# Patient Record
Sex: Female | Born: 1935 | Race: Black or African American | Hispanic: No | Marital: Single | Smoking: Never smoker
Health system: Southern US, Community
[De-identification: ages and names within clinical notes are randomized; demographics above are authoritative.]

## PROBLEM LIST (undated history)

## (undated) DIAGNOSIS — I10 Essential (primary) hypertension: Secondary | ICD-10-CM

## (undated) DIAGNOSIS — H409 Unspecified glaucoma: Secondary | ICD-10-CM

## (undated) HISTORY — DX: Essential (primary) hypertension: I10

## (undated) HISTORY — DX: Unspecified glaucoma: H40.9

---

## 1997-10-30 ENCOUNTER — Other Ambulatory Visit: Admission: RE | Admit: 1997-10-30 | Discharge: 1997-10-30 | Payer: Self-pay | Admitting: Family Medicine

## 2005-03-22 ENCOUNTER — Emergency Department (HOSPITAL_COMMUNITY): Admission: EM | Admit: 2005-03-22 | Discharge: 2005-03-23 | Payer: Self-pay | Admitting: Emergency Medicine

## 2006-07-30 ENCOUNTER — Emergency Department (HOSPITAL_COMMUNITY): Admission: EM | Admit: 2006-07-30 | Discharge: 2006-07-30 | Payer: Self-pay | Admitting: Emergency Medicine

## 2010-05-08 ENCOUNTER — Encounter: Admission: RE | Admit: 2010-05-08 | Discharge: 2010-05-08 | Payer: Self-pay | Admitting: Family Medicine

## 2017-05-11 ENCOUNTER — Encounter: Payer: Self-pay | Admitting: Nurse Practitioner

## 2017-05-23 ENCOUNTER — Ambulatory Visit: Payer: Self-pay | Admitting: Nurse Practitioner

## 2017-06-03 ENCOUNTER — Ambulatory Visit: Payer: Medicaid Other | Admitting: Physician Assistant

## 2017-06-16 ENCOUNTER — Ambulatory Visit: Payer: Medicaid Other | Admitting: Physician Assistant

## 2017-06-22 ENCOUNTER — Other Ambulatory Visit: Payer: Self-pay | Admitting: *Deleted

## 2017-06-22 ENCOUNTER — Encounter: Payer: Self-pay | Admitting: *Deleted

## 2017-06-29 ENCOUNTER — Other Ambulatory Visit (INDEPENDENT_AMBULATORY_CARE_PROVIDER_SITE_OTHER): Payer: Medicare Other

## 2017-06-29 ENCOUNTER — Encounter: Payer: Self-pay | Admitting: Physician Assistant

## 2017-06-29 ENCOUNTER — Ambulatory Visit (INDEPENDENT_AMBULATORY_CARE_PROVIDER_SITE_OTHER): Payer: Medicare Other | Admitting: Physician Assistant

## 2017-06-29 VITALS — BP 126/62 | HR 74 | Ht 60.0 in | Wt 178.1 lb

## 2017-06-29 DIAGNOSIS — Z90710 Acquired absence of both cervix and uterus: Secondary | ICD-10-CM | POA: Insufficient documentation

## 2017-06-29 DIAGNOSIS — R945 Abnormal results of liver function studies: Secondary | ICD-10-CM

## 2017-06-29 DIAGNOSIS — I10 Essential (primary) hypertension: Secondary | ICD-10-CM | POA: Diagnosis not present

## 2017-06-29 DIAGNOSIS — H905 Unspecified sensorineural hearing loss: Secondary | ICD-10-CM | POA: Diagnosis not present

## 2017-06-29 DIAGNOSIS — R7989 Other specified abnormal findings of blood chemistry: Secondary | ICD-10-CM

## 2017-06-29 DIAGNOSIS — K219 Gastro-esophageal reflux disease without esophagitis: Secondary | ICD-10-CM

## 2017-06-29 LAB — CBC WITH DIFFERENTIAL/PLATELET
Basophils Absolute: 0.1 10*3/uL (ref 0.0–0.1)
Basophils Relative: 1 % (ref 0.0–3.0)
Eosinophils Absolute: 0.2 10*3/uL (ref 0.0–0.7)
Eosinophils Relative: 3 % (ref 0.0–5.0)
HCT: 36 % (ref 36.0–46.0)
Hemoglobin: 11.4 g/dL — ABNORMAL LOW (ref 12.0–15.0)
Lymphocytes Relative: 34.1 % (ref 12.0–46.0)
Lymphs Abs: 2.1 10*3/uL (ref 0.7–4.0)
MCHC: 31.7 g/dL (ref 30.0–36.0)
MCV: 89 fl (ref 78.0–100.0)
Monocytes Absolute: 0.6 10*3/uL (ref 0.1–1.0)
Monocytes Relative: 9.9 % (ref 3.0–12.0)
Neutro Abs: 3.2 10*3/uL (ref 1.4–7.7)
Neutrophils Relative %: 52 % (ref 43.0–77.0)
Platelets: 245 10*3/uL (ref 150.0–400.0)
RBC: 4.05 Mil/uL (ref 3.87–5.11)
RDW: 14.3 % (ref 11.5–15.5)
WBC: 6.2 10*3/uL (ref 4.0–10.5)

## 2017-06-29 LAB — COMPREHENSIVE METABOLIC PANEL
ALBUMIN: 3.9 g/dL (ref 3.5–5.2)
ALT: 37 U/L — AB (ref 0–35)
AST: 39 U/L — AB (ref 0–37)
Alkaline Phosphatase: 388 U/L — ABNORMAL HIGH (ref 39–117)
BILIRUBIN TOTAL: 0.4 mg/dL (ref 0.2–1.2)
BUN: 29 mg/dL — AB (ref 6–23)
CALCIUM: 9.8 mg/dL (ref 8.4–10.5)
CO2: 27 mEq/L (ref 19–32)
CREATININE: 0.91 mg/dL (ref 0.40–1.20)
Chloride: 105 mEq/L (ref 96–112)
GFR: 74.93 mL/min (ref >60.00)
Glucose, Bld: 115 mg/dL — ABNORMAL HIGH (ref 70–99)
Potassium: 4.1 mEq/L (ref 3.5–5.1)
SODIUM: 139 meq/L (ref 135–145)
Total Protein: 7.9 g/dL (ref 6.0–8.3)

## 2017-06-29 LAB — LIPASE: Lipase: 26 U/L (ref 11.0–59.0)

## 2017-06-29 NOTE — Progress Notes (Signed)
Agree with assessment and plan as outlined. Repeat LFTs and await imaging first. If imaging normal and LFT elevation persists will need further serologic workup. Drug induced liver injury also possible, not sure if any medications or supplements are new which could correlate with time course.

## 2017-06-29 NOTE — Patient Instructions (Signed)
Please go to the basement level to have your labs drawn.  You have been scheduled for a CT scan of the abdomen and pelvis at [facility] ([address]---this is in the same building as Press [facility]).   You are scheduled on Thursday 07-07-2017 at 9:30 am You should arrive at 9:15 minutes prior to your appointment time for registration. Please follow the written instructions below on the day of your exam:  WARNING: IF YOU ARE ALLERGIC TO IODINE/X-RAY DYE, PLEASE NOTIFY RADIOLOGY IMMEDIATELY AT [phone]! YOU WILL BE GIVEN A 13 HOUR PREMEDICATION PREP.  1) Do not eat  anything after 5:30 am (4 hours prior to your test) 2) You have been given 2 bottles of oral contrast to drink. The solution may taste               better if refrigerated, but do NOT add ice or any other liquid to this solution. Shake             well before drinking.    Drink 1 bottle of contrast @ 7:30 am (2 hours prior to your exam)  Drink 1 bottle of contrast @ 8:30 am (1 hour prior to your exam)  You may take any medications as prescribed with a small amount of water except for the following: Metformin, Glucophage, Glucovance, Avandamet, Riomet, Fortamet, Actoplus Met, Janumet, Glumetza or Metaglip. The above medications must be held the day of the exam AND 48 hours after the exam.  The purpose of you drinking the oral contrast is to aid in the visualization of your intestinal tract. The contrast solution may cause some diarrhea. Before your exam is started, you will be given a small amount of fluid to drink. Depending on your individual set of symptoms, you may also receive an intravenous injection of x-ray contrast/dye. Plan on being at [facility] for 30 minutes or long, depending on the type of exam you are having performed.  If you have any questions regarding your exam or if you need to reschedule, you may call the CT department at [phone] between the hours of 8:00 am and 5:00 pm,  Monday-Friday.  ________________________________________________________________________

## 2017-06-29 NOTE — Progress Notes (Signed)
Subjective:    Patient ID: Denise Wong, female    DOB: 12/19/1926, 83 y.o.   MRN: [id number]  HPI Denise Wong is a Denise 83 year old African-American female, referred today by [facility]/Dr Cyndie ChimeNguyen for evaluation of elevation of LFTs. Patient has history of congenital deafness, hypertension, hyperlipidemia, GERD and osteoarthritis. She is status post hysterectomy. Communication is difficult with this patient but she is able to read and write simple answers. She denies any abdominal pain, nausea or vomiting. The staff member who accompanied her today says she eats very well. Patient is not aware of any history of liver disease. She had labs*on 04/26/2017 showing a T bili 0.3, ALT of 99, AST of 108 and alkaline phosphatase of 782. I do not have any other labs or imaging available today. She does not have any records available in Epic.  Review of Systems Pertinent positive and negative review of systems were noted in the above HPI section.  All other review of systems was otherwise negative.  Outpatient Encounter Medications as of 06/29/2017  Medication Sig  . Acetaminophen 500 MG coapsule Take by mouth as needed.  Marland Kitchen. amLODipine (NORVASC) 5 MG tablet Take 5 mg by mouth daily.  Marland Kitchen. atorvastatin (LIPITOR) 20 MG tablet Take 20 mg by mouth daily.  . carvedilol (COREG) 12.5 MG tablet Take 12.5 mg by mouth 2 (two) times daily with a meal.  . Cholecalciferol (VITAMIN D3) 1000 units CAPS Take by mouth.  . fexofenadine (ALLEGRA) 180 MG tablet Take 180 mg by mouth daily.  . fluticasone (FLOVENT DISKUS) 50 MCG/BLIST diskus inhaler Inhale 1 puff into the lungs 2 (two) times daily.  Marland Kitchen. gabapentin (NEURONTIN) 300 MG capsule Take 300 mg by mouth 3 (three) times daily.  . Melatonin 3 MG CAPS Take by mouth 1 day or 1 dose.  . meloxicam (MOBIC) 7.5 MG tablet Take 7.5 mg by mouth daily.  Marland Kitchen. Phenylephrine-DM-GG (MUCINEX COLD CHILDRENS PO) Take 15 mLs by mouth 2 (two) times daily.  Bertram Gala.  Polyethyl Glycol-Propyl Glycol (SYSTANE) 0.4-0.3 % SOLN Apply to eye.  . polyvinyl alcohol (LIQUIFILM TEARS) 1.4 % ophthalmic solution Place 1 drop into both eyes as needed.  Marland Kitchen. QUEtiapine (SEROQUEL) 50 MG tablet Take 50 mg by mouth at bedtime.  . ranitidine (ZANTAC) 150 MG tablet Take 150 mg by mouth 2 (two) times daily.  . sertraline (ZOLOFT) 25 MG tablet Take 25 mg by mouth daily.  . timolol (BETIMOL) 0.5 % ophthalmic solution Place 1 drop into both eyes 2 (two) times daily.  . timolol (TIMOPTIC) 0.5 % ophthalmic solution Place 1 drop into both eyes 2 (two) times daily.  . Travoprost, BAK Free, (TRAVATAN Z) 0.004 % SOLN ophthalmic solution Place 1 drop into both eyes at bedtime.  . trolamine salicylate (ASPERCREME) 10 % cream Apply 1 application topically as needed for muscle pain.  Marland Kitchen. venlafaxine (EFFEXOR) 75 MG tablet Take 75 mg by mouth 2 (two) times daily.   No facility-administered encounter medications on file as of 06/29/2017.    Not on File There are no active problems to display for this patient.  Social History   Socioeconomic History  . Marital status: Single    Spouse name: Not on file  . Number of children: Not on file  . Years of education: Not on file  . Highest education level: Not on file  Social Needs  . Financial resource strain: Not on file  . Food insecurity - worry: Not on file  .  Food insecurity - inability: Not on file  . Transportation needs - medical: Not on file  . Transportation needs - non-medical: Not on file  Occupational History  . Not on file  Tobacco Use  . Smoking status: Never Smoker  . Smokeless tobacco: Never Used  Substance and Sexual Activity  . Alcohol use: No    Frequency: Never  . Drug use: No  . Sexual activity: No  Other Topics Concern  . Not on file  Social History Narrative  . Not on file    Denise Wong'Denise family history is not on file.      Objective:    Vitals:   06/29/17 1407  BP: 126/62  Pulse: 74    Physical  Exam  well-developed elderly African-American female in no acute distress, patient is deaf, she communicates by writing, she does not saw 9 and does not read lips. She is accompanied by a staff member from her assisted living. Blood pressure 126/62 pulse 74, BMI 34.7. HEENT; nontraumatic normocephalic EOMI PERRLA sclera anicteric, Cardiovascular; regular rate and rhythm with S1-S2, Pulmonary; clear bilaterally, Abdomen ;soft, nontender nondistended no palpable mass or hepatosplenomegaly, no fluid wave, bowel sounds are present,, she has a low midline incisional scar Rectal; exam not done, Ext no clubbing cyanosis or edema skin warm and dry, Neuropsych; mood and affect appropriate       Assessment & Plan:   #431 83 year old African-American female with congenital deafness, referred for elevated LFTs. Patient is asymptomatic. Etiology is not clear, rule out chronic hepatitis, underlying chronic liver disease, malignancy or drug-induced. #2 hypertension #3 GERD #4 hyperlipidemia #5 osteoarthritis  Plan; CBC with differential, CMET, lipase, Patient will be scheduled for CT scan of the abdomen and pelvis with contrast Further plans pending results of above. Patient will be established with Dr. Adela LankArmbruster.  Denise Wong Denise Shmiel Morton PA-C 06/29/2017   Cc: No ref. provider found

## 2017-07-07 ENCOUNTER — Ambulatory Visit (INDEPENDENT_AMBULATORY_CARE_PROVIDER_SITE_OTHER)
Admission: RE | Admit: 2017-07-07 | Discharge: 2017-07-07 | Disposition: A | Discharge status: Home / Self Care | Payer: Medicare Other | Source: Ambulatory Visit | Attending: Physician Assistant | Admitting: Physician Assistant

## 2017-07-07 DIAGNOSIS — R945 Abnormal results of liver function studies: Secondary | ICD-10-CM | POA: Diagnosis not present

## 2017-07-07 DIAGNOSIS — R7989 Other specified abnormal findings of blood chemistry: Secondary | ICD-10-CM

## 2017-07-07 MED ORDER — IOPAMIDOL (ISOVUE-300) INJECTION 61%
100.0000 mL | Freq: Once | INTRAVENOUS | Status: AC | PRN
  Administered 2017-07-07: 100 mL via INTRAVENOUS

## 2017-08-25 ENCOUNTER — Ambulatory Visit: Payer: Medicaid Other | Admitting: Gastroenterology

## 2018-02-19 IMAGING — CT CT ABD-PELV W/ CM
2 of 5 series · 15 of 46 positions shown, 17 images · IV contrast (iopamidol)
Comparison: None.

CLINICAL DATA: Elevated liver enzymes

EXAM:
CT ABDOMEN AND PELVIS WITH CONTRAST
TECHNIQUE: Multidetector CT imaging of the abdomen and pelvis was performed
using the standard protocol following bolus administration of
intravenous contrast. Oral contrast was also administered.
CONTRAST:  100mL [id number] IOPAMIDOL ([id number]) INJECTION 61%

[Series 2: abd/pel w · axial · 0.76mm/px · z∈[-390,-10]mm · 12 of 86 slices shown, 14 images]
[im 5/86  soft-tissue]
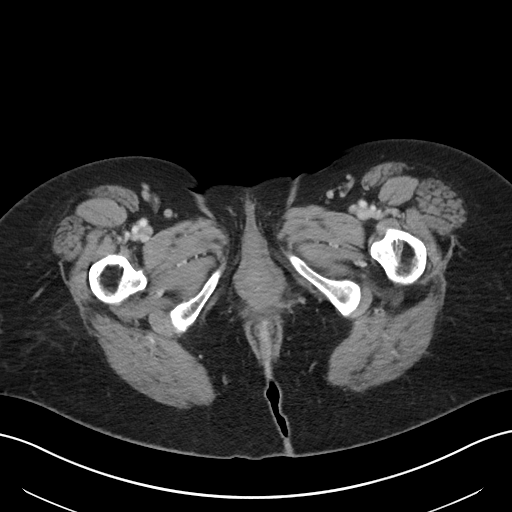
[im 5/86  bone]
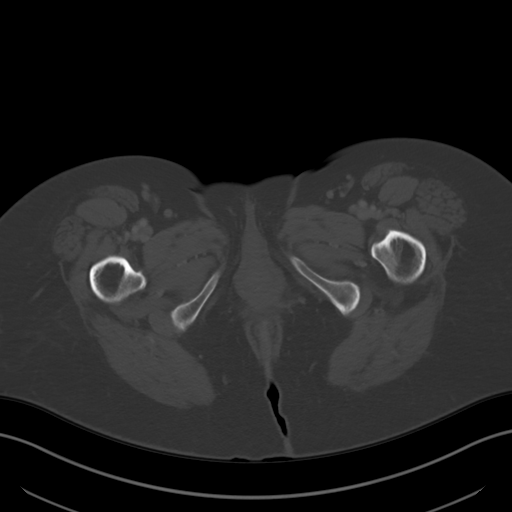
[im 13/86  soft-tissue]
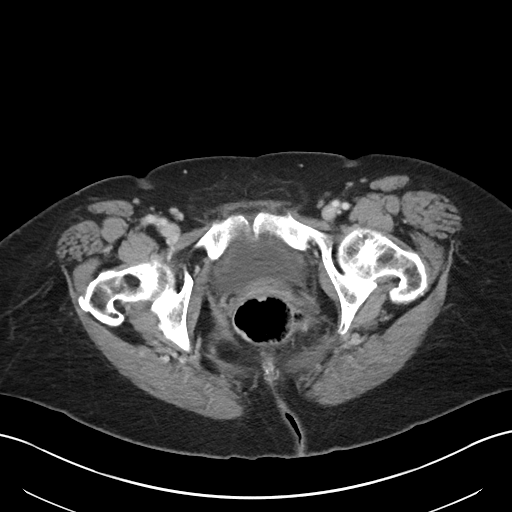
[im 18/86  soft-tissue]
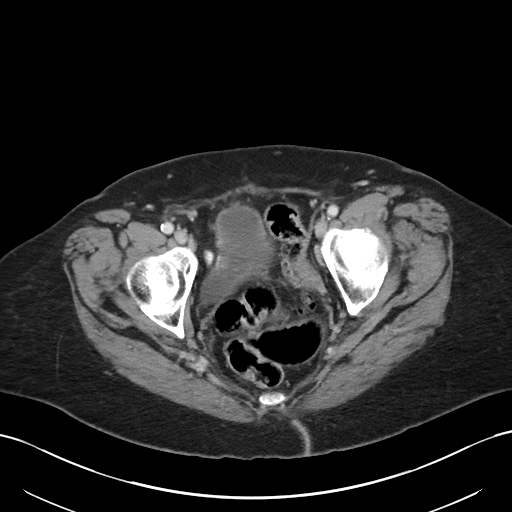
[im 26/86  soft-tissue]
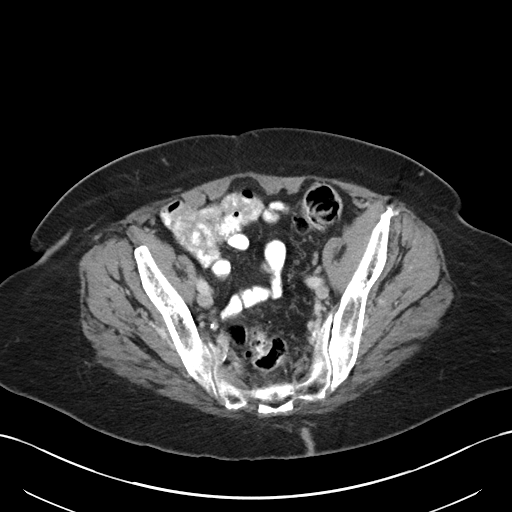
[im 35/86  soft-tissue]
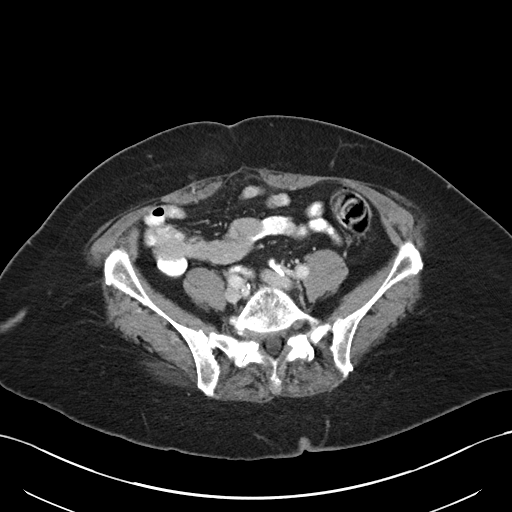
[im 39/86  soft-tissue]
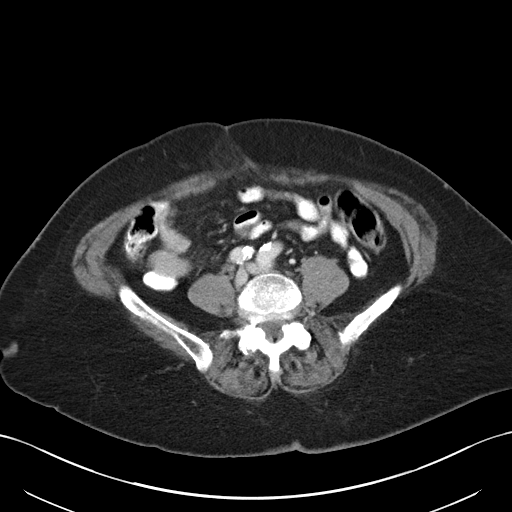
[im 47/86  soft-tissue]
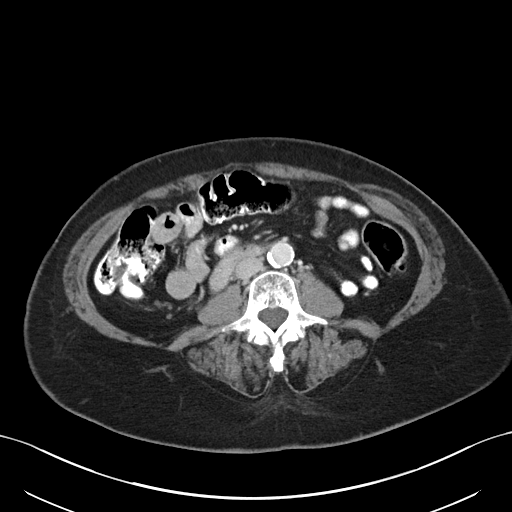
[im 52/86  soft-tissue]
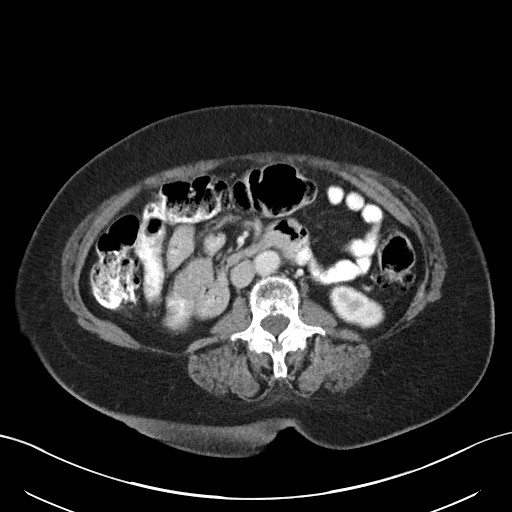
[im 60/86  soft-tissue]
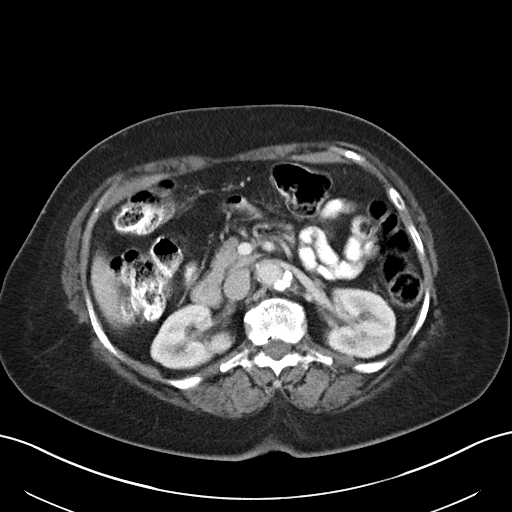
[im 60/86  bone]
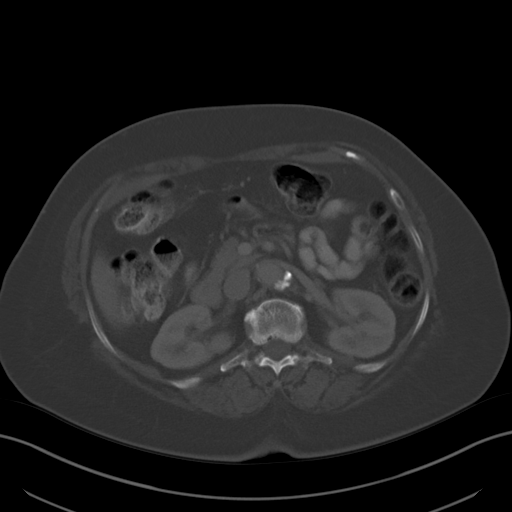
[im 69/86  soft-tissue]
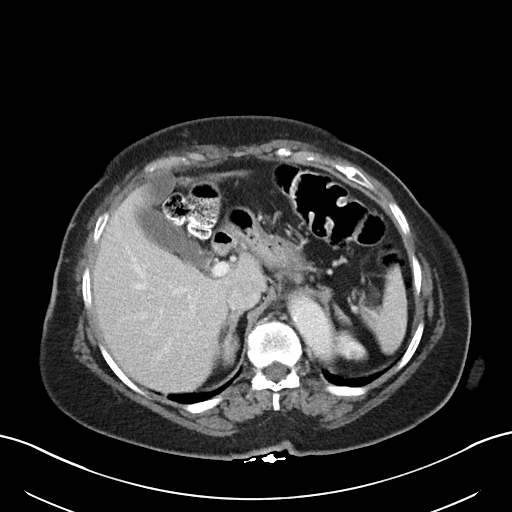
[im 73/86  soft-tissue]
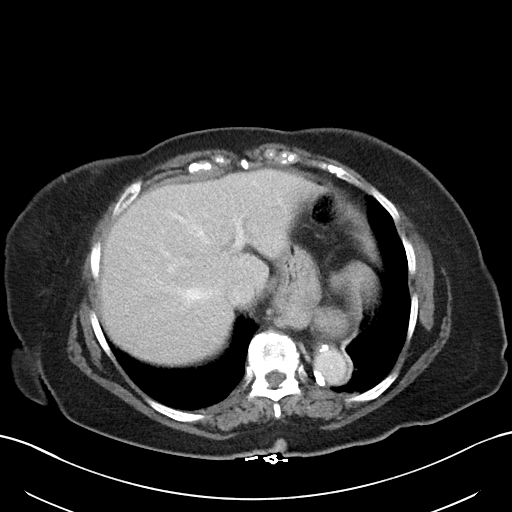
[im 81/86  soft-tissue]
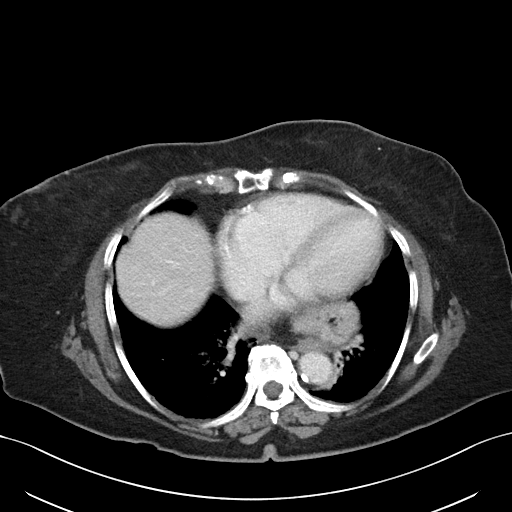

[Series 6: abd/pel w st · coronal · 0.64mm/px · 3 of 73 slices shown]
[im 25/73  soft-tissue]
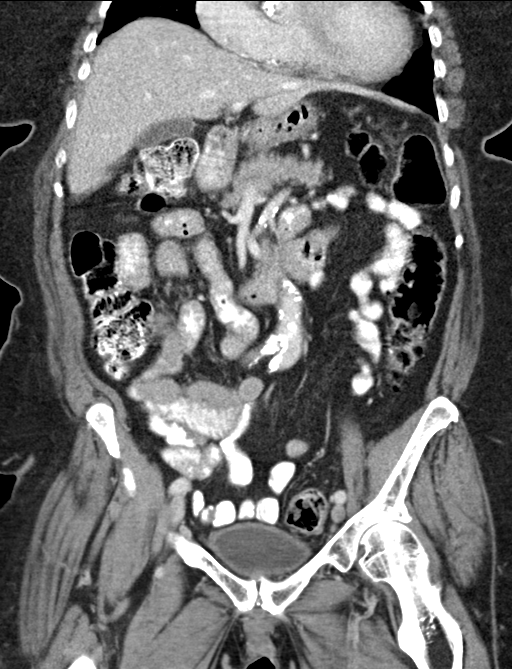
[im 33/73  soft-tissue]
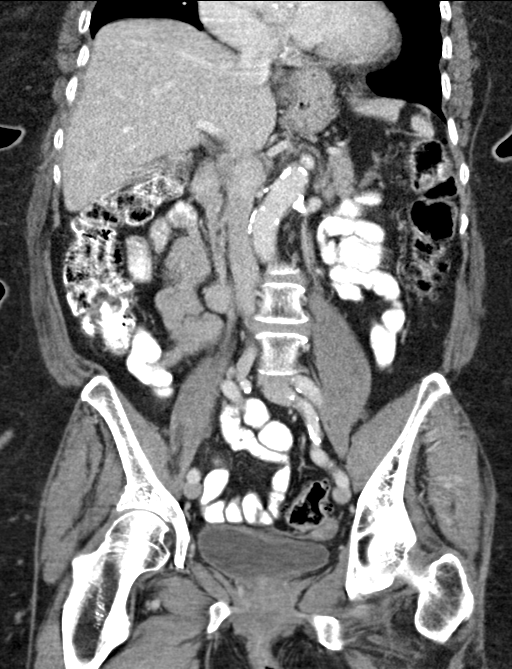
[im 41/73  soft-tissue]
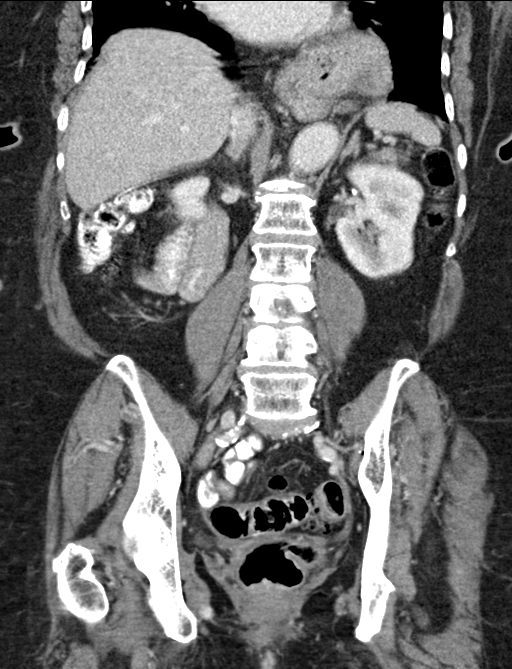

[15 of 46 positions shown; findings below may reference images not displayed]

FINDINGS: Lower chest: There is bibasilar atelectasis. There is a fairly
sizable hiatal hernia. There are foci of coronary artery
calcification.

Hepatobiliary: There is a small calcification medial segment of the
left lobe of the liver, a likely small granuloma. No focal liver
lesion is evident on this study. Gallbladder wall is not appreciably
thickened. There is no biliary duct dilatation.

Pancreas: There is no pancreatic mass or inflammatory focus.

Spleen: No splenic lesions are appreciable.

Adrenals/Urinary Tract: Adrenals bilaterally appear normal. Kidneys
bilaterally show no evident mass or hydronephrosis on either side.
There is no renal or ureteral calculus on either side. Urinary
bladder is midline with wall thickness within normal limits.

Stomach/Bowel: There are multiple sigmoid diverticula without
diverticulitis. There is no bowel wall or mesenteric thickening. No
bowel obstruction. No free air or portal venous air.

Vascular/Lymphatic: The aorta is tortuous without aneurysm. There is
atherosclerotic calcification throughout the aorta and common iliac
arteries. There is calcification extending into both internal iliac
arteries as well as in the both distal common femoral arteries.
There is fairly extensive calcification in the proximal major
mesenteric arterial vessels without occlusion. There is no
adenopathy in the abdomen or pelvis.

Reproductive: Prostate and seminal vesicles appear normal in size
and contour. No pelvic masses evident.

Other: There is no periappendiceal region inflammation. There is no
abscess or ascites in the abdomen or pelvis. There is a focal fairly
small ventral hernia containing only fat. There is fat in the right
inguinal ring.

Musculoskeletal: There is multilevel degenerative change in the
lower thoracic and lumbar spine regions. There is no blastic or
lytic bone lesion. There is no intramuscular or abdominal wall
lesion.
IMPRESSION: 1. No liver lesions are appreciable beyond a small calcified
granuloma in the medial segment left lobe. No biliary duct
dilatation evident.

2. There are sigmoid diverticula without diverticulitis. No bowel
obstruction. No abscess. No peri appendiceal region inflammation.

3.  No renal or ureteral calculus.  No hydronephrosis.

4. Sizable hiatal hernia. Fairly small ventral hernia containing
only fat.

5. Extensive aortoiliac atherosclerosis. Fairly extensive
calcification in the proximal major mesenteric arterial vessels
without frank occlusion. Foci of coronary artery calcification also
noted.

Aortic Atherosclerosis ([id number]-Y72.2).
